# Patient Record
Sex: Female | Born: 2000 | Race: Black or African American | Hispanic: No | Marital: Single | Smoking: Never smoker
Health system: Southern US, Community
[De-identification: ages and names within clinical notes are randomized; demographics above are authoritative.]

---
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
## 2022-08-04 ENCOUNTER — Encounter (HOSPITAL_BASED_OUTPATIENT_CLINIC_OR_DEPARTMENT_OTHER): Payer: Self-pay

## 2022-08-04 ENCOUNTER — Other Ambulatory Visit: Payer: Self-pay

## 2022-08-04 ENCOUNTER — Emergency Department (HOSPITAL_BASED_OUTPATIENT_CLINIC_OR_DEPARTMENT_OTHER): Payer: Medicaid Other

## 2022-08-04 ENCOUNTER — Emergency Department (HOSPITAL_BASED_OUTPATIENT_CLINIC_OR_DEPARTMENT_OTHER)
Admission: EM | Admit: 2022-08-04 | Discharge: 2022-08-04 | Disposition: A | Discharge status: Home / Self Care | Payer: Medicaid Other | Attending: Emergency Medicine | Admitting: Emergency Medicine

## 2022-08-04 DIAGNOSIS — M25511 Pain in right shoulder: Secondary | ICD-10-CM | POA: Diagnosis not present

## 2022-08-04 DIAGNOSIS — Y92410 Unspecified street and highway as the place of occurrence of the external cause: Secondary | ICD-10-CM | POA: Diagnosis not present

## 2022-08-04 DIAGNOSIS — M549 Dorsalgia, unspecified: Secondary | ICD-10-CM

## 2022-08-04 DIAGNOSIS — M546 Pain in thoracic spine: Secondary | ICD-10-CM | POA: Insufficient documentation

## 2022-08-04 DIAGNOSIS — M542 Cervicalgia: Secondary | ICD-10-CM | POA: Diagnosis present

## 2022-08-04 MED ORDER — IBUPROFEN 400 MG PO TABS
600.0000 mg | ORAL_TABLET | Freq: Once | ORAL | Status: AC
  Administered 2022-08-04: 600 mg via ORAL
  Filled 2022-08-04: qty 1
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
## 2023-08-10 DIAGNOSIS — Z23 Encounter for immunization: Secondary | ICD-10-CM | POA: Diagnosis not present

## 2023-08-10 DIAGNOSIS — Z7189 Other specified counseling: Secondary | ICD-10-CM | POA: Diagnosis not present

## 2023-08-10 DIAGNOSIS — J301 Allergic rhinitis due to pollen: Secondary | ICD-10-CM | POA: Diagnosis not present

## 2023-08-10 DIAGNOSIS — Z0000 Encounter for general adult medical examination without abnormal findings: Secondary | ICD-10-CM | POA: Diagnosis not present

## 2023-08-10 DIAGNOSIS — Z713 Dietary counseling and surveillance: Secondary | ICD-10-CM | POA: Diagnosis not present

## 2023-08-10 DIAGNOSIS — Z6821 Body mass index (BMI) 21.0-21.9, adult: Secondary | ICD-10-CM | POA: Diagnosis not present

## 2023-08-10 DIAGNOSIS — L700 Acne vulgaris: Secondary | ICD-10-CM | POA: Diagnosis not present

## 2023-08-10 DIAGNOSIS — Z118 Encounter for screening for other infectious and parasitic diseases: Secondary | ICD-10-CM | POA: Diagnosis not present

## 2023-08-10 DIAGNOSIS — Z91030 Bee allergy status: Secondary | ICD-10-CM | POA: Diagnosis not present

## 2024-01-31 DIAGNOSIS — Z91030 Bee allergy status: Secondary | ICD-10-CM | POA: Diagnosis not present

## 2024-01-31 DIAGNOSIS — J020 Streptococcal pharyngitis: Secondary | ICD-10-CM | POA: Diagnosis not present

## 2024-08-04 ENCOUNTER — Ambulatory Visit: Payer: Self-pay
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
